# Patient Record
Sex: Female | Born: 1945 | Race: White | Hispanic: No | Marital: Married
Health system: Southern US, Community
[De-identification: ages and names within clinical notes are randomized; demographics above are authoritative.]

## PROBLEM LIST (undated history)

## (undated) DIAGNOSIS — E785 Hyperlipidemia, unspecified: Secondary | ICD-10-CM

## (undated) DIAGNOSIS — E559 Vitamin D deficiency, unspecified: Secondary | ICD-10-CM

## (undated) DIAGNOSIS — F319 Bipolar disorder, unspecified: Secondary | ICD-10-CM

## (undated) DIAGNOSIS — J189 Pneumonia, unspecified organism: Secondary | ICD-10-CM

## (undated) DIAGNOSIS — I10 Essential (primary) hypertension: Secondary | ICD-10-CM

## (undated) DIAGNOSIS — M810 Age-related osteoporosis without current pathological fracture: Secondary | ICD-10-CM

## (undated) DIAGNOSIS — F329 Major depressive disorder, single episode, unspecified: Secondary | ICD-10-CM

## (undated) DIAGNOSIS — F32A Depression, unspecified: Secondary | ICD-10-CM

## (undated) DIAGNOSIS — R110 Nausea: Secondary | ICD-10-CM

## (undated) DIAGNOSIS — E7800 Pure hypercholesterolemia, unspecified: Secondary | ICD-10-CM

## (undated) DIAGNOSIS — K219 Gastro-esophageal reflux disease without esophagitis: Secondary | ICD-10-CM

## (undated) DIAGNOSIS — K589 Irritable bowel syndrome without diarrhea: Secondary | ICD-10-CM

## (undated) HISTORY — PX: APPENDECTOMY: SHX54

## (undated) HISTORY — DX: Essential (primary) hypertension: I10

## (undated) HISTORY — DX: Vitamin D deficiency, unspecified: E55.9

## (undated) HISTORY — DX: Age-related osteoporosis without current pathological fracture: M81.0

## (undated) HISTORY — PX: TUBAL LIGATION: SHX77

## (undated) HISTORY — DX: Hyperlipidemia, unspecified: E78.5

## (undated) HISTORY — DX: Major depressive disorder, single episode, unspecified: F32.9

## (undated) HISTORY — DX: Pure hypercholesterolemia, unspecified: E78.00

## (undated) HISTORY — DX: Pneumonia, unspecified organism: J18.9

## (undated) HISTORY — PX: LAPAROSCOPIC OOPHERECTOMY: SHX6507

## (undated) HISTORY — PX: ABDOMINAL HYSTERECTOMY: SHX81

## (undated) HISTORY — DX: Nausea: R11.0

## (undated) HISTORY — DX: Depression, unspecified: F32.A

## (undated) HISTORY — PX: TONSILLECTOMY: SUR1361

## (undated) HISTORY — DX: Irritable bowel syndrome without diarrhea: K58.9

## (undated) HISTORY — DX: Gastro-esophageal reflux disease without esophagitis: K21.9

## (undated) HISTORY — DX: Bipolar disorder, unspecified: F31.9

---

## 1997-10-28 ENCOUNTER — Other Ambulatory Visit: Admission: RE | Admit: 1997-10-28 | Discharge: 1997-10-28 | Payer: Self-pay | Admitting: *Deleted

## 1999-01-15 ENCOUNTER — Other Ambulatory Visit: Admission: RE | Admit: 1999-01-15 | Discharge: 1999-01-15 | Payer: Self-pay | Admitting: *Deleted

## 2017-12-26 ENCOUNTER — Other Ambulatory Visit: Payer: Self-pay | Admitting: Orthopedic Surgery

## 2017-12-26 DIAGNOSIS — M4712 Other spondylosis with myelopathy, cervical region: Secondary | ICD-10-CM

## 2017-12-30 ENCOUNTER — Ambulatory Visit
Admission: RE | Admit: 2017-12-30 | Discharge: 2017-12-30 | Disposition: A | Discharge status: Home / Self Care | Payer: Medicare Other | Source: Ambulatory Visit | Attending: Orthopedic Surgery | Admitting: Orthopedic Surgery

## 2017-12-30 DIAGNOSIS — M4712 Other spondylosis with myelopathy, cervical region: Secondary | ICD-10-CM

## 2018-01-03 ENCOUNTER — Telehealth: Payer: Self-pay | Admitting: Neurology

## 2018-01-03 ENCOUNTER — Encounter

## 2018-01-03 ENCOUNTER — Ambulatory Visit (INDEPENDENT_AMBULATORY_CARE_PROVIDER_SITE_OTHER): Payer: Medicare Other | Admitting: Neurology

## 2018-01-03 VITALS — BP 123/77 | HR 78 | Ht 62.5 in | Wt 137.5 lb

## 2018-01-03 DIAGNOSIS — R296 Repeated falls: Secondary | ICD-10-CM | POA: Diagnosis not present

## 2018-01-03 DIAGNOSIS — R252 Cramp and spasm: Secondary | ICD-10-CM

## 2018-01-03 DIAGNOSIS — W19XXXS Unspecified fall, sequela: Secondary | ICD-10-CM

## 2018-01-03 NOTE — Progress Notes (Signed)
Subjective:    Patient ID: Rachel Rhodes is a 72 y.o. female.  HPI     Rachel Foley, MD, PhD [facility] [address], [address] [address] [place], [place] [address]  Dear Rachel Rhodes,   I saw your patient, Rachel Rhodes, upon your kind request, in my neurologic clinic today for initial consultation of her tremors and recurrent falls. The patient is accompanied by her husband today. As you know, Rachel Rhodes is a 72 year old right-handed woman with an underlying medical history of vitamin D deficiency, reflux disease, IBS, history of C. difficile colitis, history of pneumonia, smoking, hypertension, osteoporosis, bipoler d/o, and mildly overweight state, who reports involuntary jerk-like movements in her lower extremities and recurrent falls and balance issues. Her husband reports that since she came off of Geodon her symptoms have improved greatly. She was on Geodon for at least a year and has been off of it for about 2 months. She was started on Zyprexa after she stopped the Geodon. Unfortunately, the Zyprexa is not working as well for her mood disorder and she did quite well on Geodon from the mood standpoint. Her lower extremities were jerking involuntarily and that led to some falls. This has essentially subsided. I reviewed your office note from 11/10/17.  Of note, she recently fell and injured her wrist, sustained a L distal radius fracture.  She had a neck MRI without contrast on 12/30/2017 and I reviewed the results: IMPRESSION: 1. History of trauma.  No fracture or evidence soft tissue injury. 2. 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 3. 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 4. Foraminal impingement on the left at C3-4, bilaterally at C5-6, and right more than left at C6-7. 5. Diffusely patent spinal canal.  Of note, patient is on multiple potentially sedating medications including imipramine, fluoxetine, modafinil,  olanzapine, Xanax generic. She does not drink any alcohol, she tries to hydrate well with water, she smokes about 5 cigarettes per day on average.  She had a CTH w/wo contrast through your office on 11/14/2017 and I reviewed the results: Impression: No acute intracranial abnormality. Small vessel ischemic changes. No evidence of tumor, hemorrhage, hydrocephalus, herniation or acute infarction. No change.   Her Past Medical History Is Significant For: Past Medical History:  Diagnosis Date  . Bipolar disorder (HCC)   . Depressive disorder   . GERD (gastroesophageal reflux disease)   . Hypercholesteremia   . Hyperlipemia   . Hypertension   . IBS (irritable bowel syndrome)   . Nausea   . Osteoporosis   . Pneumonia   . Vitamin D deficiency     Her Past Surgical History Is Significant For:  Her Family History Is Significant For: No family history on file.  Her Social History Is Significant For: Social History   Socioeconomic History  . Marital status: Married    Spouse name: Not on file  . Number of children: Not on file  . Years of education: Not on file  . Highest education level: Not on file  Occupational History  . Not on file  Social Needs  . Financial resource strain: Not on file  . Food insecurity:    Worry: Not on file    Inability: Not on file  . Transportation needs:    Medical: Not on file    Non-medical: Not on file  Tobacco Use  . Smoking status: Not on file  Substance and Sexual Activity  .  Alcohol use: Not on file  . Drug use: Not on file  . Sexual activity: Not on file  Lifestyle  . Physical activity:    Days per week: Not on file    Minutes per session: Not on file  . Stress: Not on file  Relationships  . Social connections:    Talks on phone: Not on file    Gets together: Not on file    Attends religious service: Not on file    Active member of club or organization: Not on file    Attends meetings of clubs or organizations: Not on file     Relationship status: Not on file  Other Topics Concern  . Not on file  Social History Narrative  . Not on file    Her Allergies Are:  Allergies not on file:   Her Current Medications Are:  Outpatient Encounter Medications as of 01/03/2018  Medication Sig  . acetaminophen (TYLENOL) 500 MG tablet Take 500 mg by mouth every 6 (six) hours as needed.  Marland Kitchen alendronate (FOSAMAX) 70 MG tablet Take 70 mg by mouth once a week. Take with a full glass of water on an empty stomach.  . ALPRAZolam (XANAX) 0.5 MG tablet Take 0.5 mg by mouth 3 (three) times daily.  Marland Kitchen atorvastatin (LIPITOR) 10 MG tablet Take 10 mg by mouth daily.  Marland Kitchen bismuth subsalicylate (PEPTO-BISMOL) 262 MG/15ML suspension Take 15 mLs by mouth every 6 (six) hours as needed for indigestion.  . cycloSPORINE (RESTASIS) 0.05 % ophthalmic emulsion Place 1 drop into both eyes 2 (two) times daily.  Marland Kitchen dicyclomine (BENTYL) 10 MG capsule Take 10 mg by mouth 4 (four) times daily as needed.   . diphenoxylate-atropine (LOMOTIL) 2.5-0.025 MG tablet Take by mouth 4 (four) times daily as needed for diarrhea or loose stools.  . ergocalciferol (VITAMIN D2) 50000 units capsule Take 50,000 Units by mouth once a week.  Marland Kitchen FLUoxetine (PROZAC) 40 MG capsule Take 80 mg by mouth daily.  Marland Kitchen imipramine (TOFRANIL) 25 MG tablet Take 75 mg by mouth at bedtime.  . modafinil (PROVIGIL) 100 MG tablet Take 100-200 mg by mouth daily.   Marland Kitchen OLANZapine (ZYPREXA) 5 MG tablet Take 5 mg by mouth at bedtime.  . ranitidine (ZANTAC) 150 MG tablet Take 300 mg by mouth 2 (two) times daily.   . verapamil (CALAN-SR) 240 MG CR tablet Take 240 mg by mouth at bedtime.  . [DISCONTINUED] cholestyramine light (PREVALITE) 4 g packet Take 4 g by mouth 2 (two) times daily.  . [DISCONTINUED] conjugated estrogens (PREMARIN) vaginal cream Place 1 Applicatorful vaginally daily.  . [DISCONTINUED] naproxen sodium (ALEVE) 220 MG tablet Take 220 mg by mouth.  . [DISCONTINUED] nystatin-triamcinolone  (MYCOLOG II) cream Apply 1 application topically 2 (two) times daily.  . [DISCONTINUED] Omega-3 Fatty Acids (FISH OIL) 1200 MG CAPS Take by mouth.  . [DISCONTINUED] ziprasidone (GEODON) 20 MG capsule Take 20 mg by mouth 2 (two) times daily with a meal.   No facility-administered encounter medications on file as of 01/03/2018.   : Review of Systems:  Out of a complete 14 point review of systems, all are reviewed and negative with the exception of these symptoms as listed below:  Review of Systems  Neurological:       Pt mentioned that she has been having falls and tremors for a year. Pt had a fall 3 weeks ago. Pt mentioned that she has some tremors in her legs.    Objective:  Neurological Exam  Physical Exam Physical Examination:   Vitals:   01/03/18 0948  BP: 123/77  Pulse: 78    General Examination: The patient is a very pleasant 72 y.o. female in no acute distress. She appears well-developed and well-nourished and well groomed.   HEENT: Normocephalic, atraumatic, pupils are equal, round and reactive to light and accommodation. Extraocular tracking is good without limitation to gaze excursion or nystagmus noted. Normal smooth pursuit is noted. Hearing is mildly impaired. Face is symmetric with normal facial animation and normal facial sensation. Speech is clear with no dysarthria noted. There is no hypophonia. There is no lip, neck/head, jaw or voice tremor. Neck is supple with full range of passive and active motion. There are no carotid bruits on auscultation. Oropharynx exam reveals: moderate mouth dryness, adequate dental hygiene.  No orofacial dyskinesias.  Chest: Clear to auscultation without wheezing, rhonchi or crackles noted.  Heart: S1+S2+0, regular and normal without murmurs, rubs or gallops noted.   Abdomen: Soft, non-tender and non-distended with normal bowel sounds appreciated on auscultation.  Extremities: There is no pitting edema in the distal lower extremities  bilaterally. Pedal pulses are intact.  Skin: Warm and dry without trophic changes noted.  Musculoskeletal: exam reveals no obvious joint deformities, tenderness or joint swelling or erythema. Left wrist splint in place.  Neurologically:  Mental status: The patient is awake, alert and oriented in all 4 spheres. Her immediate and remote memory, attention, language skills and fund of knowledge are appropriate. There is no evidence of aphasia, agnosia, apraxia or anomia. Speech is clear with normal prosody and enunciation. Thought process is linear. Mood is constricted and affect is blunted.  Cranial nerves II - XII are as described above under HEENT exam. In addition: shoulder shrug is normal with equal shoulder height noted. Motor exam: Normal bulk, strength and tone is noted. There is no drift, resting tremor or rebound. She has no significant postural or action tremor, no myoclonus, no athetoid movements, no dystonic posturing. Romberg is negative. Reflexes are 2+ throughout, L arm not checked due to splinting. Babinski: Toes are flexor bilaterally. Fine motor skills and coordination: intact with normal finger taps, normal hand movements, normal rapid alternating patting, normal foot taps and normal foot agility.  Cerebellar testing: No dysmetria or intention tremor on finger to nose testing. Heel to shin is unremarkable bilaterally. There is no truncal or gait ataxia.  Sensory exam: intact to light touch in the upper and lower extremities.  Gait, station and balance: She stands slowly and Walks slowly and cautiously, no walking aid. Tandem walk is not possible for her as she is insecure. No shuffling, preserved arm swing bilaterally.  Assessment and Plan:   In summary, OZELL FERRERA is a very pleasant 73 y.o.-year old female with an underlying medical history of vitamin D deficiency, reflux disease, IBS, history of C. difficile colitis, history of pneumonia, smoking, hypertension, osteoporosis,  bipoler d/o, and mildly overweight state, who presents for evaluation of her recurrent falls and involuntary jerking in her lower extremities. On examination, she has no focal neurological finding, no parkinsonism, no tremor. She reports that her involuntary leg movements improved after she stopped the Geodon. I do worry about her psychotropic medications as she is at risk for balance issues and sedation as well as involuntary movements. Nevertheless, she reports that she would rather suffer from medication related side effects than have suboptimally controlled bipolar disease. Unfortunately, she has struggled with her mood disorder for decades. She has tried and failed  multiple medications. She has close follow-up with her psychiatrist. She is advised that I would like to proceed with further testing in the form of brain MRI without contrast and EMG and nerve conduction testing of her lower extremities to rule out any other causes of her symptoms. Nevertheless, at this point I may not be able to add a whole lot especially if her test results are nonrevealing. We will keep her posted as to the test results by phone call. We talked about general recommendations for gait disturbance including changing positions slowly, making enough like to see at night, using a cane or walker if possible, staying well hydrated, avoiding alcohol and she was also encouraged to quit smoking. I suggested I see her back on an as-needed basis. We will call her with her test results in the interim once they are available. I answered all their questions today and the patient and her husband were in agreement.  Thank you very much for allowing me to participate in the care of this nice patient. If I can be of any further assistance to you please do not hesitate to call me at [phone].  Sincerely,   Rachel Foley, MD, PhD

## 2018-01-03 NOTE — Patient Instructions (Signed)
I'm not sure how to explain your involuntary movements. I do not see a primary neurological cause of your symptoms. In particular, you do not have a tremor currently, no signs of parkinsonism. I do believe that you are at risk for falls secondary to medication effect from taking multiple sedating and psychotropic medications including Xanax, generic Zyprexa, and imipramine.  The Geodon may have caused you to have abnormal jerking and involuntary movements.  Please remember to stand up slowly and get your bearings first turn slowly, no bending down to pick anything, no heavy lifting, be extra careful at night and first thing in the morning. Also, be careful in the Bathroom and the kitchen. Use your cane, stay well hydrated.  As discussed, I will order an EMG and nerve conduction velocity test, which is an electrical nerve and muscle test, which we will schedule. We will call you with the results. We will do a brain scan, called MRI and call you with the test results. We will have to schedule you for this on a separate date. This test requires authorization from your insurance, and we will take care of the insurance process.  So long as your Test results are reassuring, I will see you back on an as-needed basis.

## 2018-01-03 NOTE — Telephone Encounter (Signed)
Medicare/bcbs order sent to GI. No auth they will reach out to the pt to schedule.

## 2018-01-11 ENCOUNTER — Ambulatory Visit
Admission: RE | Admit: 2018-01-11 | Discharge: 2018-01-11 | Disposition: A | Discharge status: Home / Self Care | Payer: Medicare Other | Source: Ambulatory Visit | Attending: Neurology | Admitting: Neurology

## 2018-01-11 DIAGNOSIS — R296 Repeated falls: Secondary | ICD-10-CM

## 2018-01-11 DIAGNOSIS — R252 Cramp and spasm: Secondary | ICD-10-CM

## 2018-01-15 ENCOUNTER — Telehealth: Payer: Self-pay

## 2018-01-15 NOTE — Progress Notes (Signed)
Please call patient regarding the recent brain MRI without contrast: The brain scan showed a normal structure of the brain and mild volume loss which we call atrophy. There were changes in the deeper structures of the brain, which we call white matter changes or microvascular changes. These were reported as moderate in Her case. These are tiny white spots, that occur with time and are seen in a variety of conditions, including with normal aging, chronic hypertension, chronic headaches, especially migraine HAs, chronic diabetes, chronic hyperlipidemia. These are not strokes and no mass or lesions were seen which is reassuring. Again, there were no acute findings, such as a stroke, or mass or blood products. No further action is required on this test at this time, other than re-enforcing the importance of good blood pressure control, good cholesterol control, good blood sugar control, and weight management and of course complete smoking cessation is important. Please remind patient to keep any upcoming appointments or tests and to call us with any interim questions, concerns, problems or updates. EMG/NCV scheduled for next month, please remind her.   Thanks,  Huston FoleySaima Deanglo Hissong, MD, PhD

## 2018-01-15 NOTE — Telephone Encounter (Signed)
I called pt to discuss her MRI results. No answer, left a message asking her to call me back. 

## 2018-01-15 NOTE — Telephone Encounter (Signed)
-----   Message from Huston FoleySaima Athar, MD sent at 01/15/2018  8:32 AM EDT ----- Please call patient regarding the recent brain MRI without contrast: The brain scan showed a normal structure of the brain and mild volume loss which we call atrophy. There were changes in the deeper structures of the brain, which we call white matter changes or microvascular changes. These were reported as moderate in Her case. These are tiny white spots, that occur with time and are seen in a variety of conditions, including with normal aging, chronic hypertension, chronic headaches, especially migraine HAs, chronic diabetes, chronic hyperlipidemia. These are not strokes and no mass or lesions were seen which is reassuring. Again, there were no acute findings, such as a stroke, or mass or blood products. No further action is required on this test at this time, other than re-enforcing the importance of good blood pressure control, good cholesterol control, good blood sugar control, and weight management and of course complete smoking cessation is important. Please remind patient to keep any upcoming appointments or tests and to call us with any interim questions, concerns, problems or updates. EMG/NCV scheduled for next month, please remind her.   Thanks,  Huston FoleySaima Athar, MD, PhD

## 2018-01-16 NOTE — Telephone Encounter (Signed)
Patient is returning your call.  

## 2018-01-16 NOTE — Telephone Encounter (Signed)
I called pt again to discuss her sleep study results, no answer, left a message asking her to call me back. 

## 2018-01-17 NOTE — Telephone Encounter (Signed)
I called pt and advised her of her MRI results and recommendations from Dr. Frances FurbishAthar. Pt asked that I send a copy of these results to Darryl [facility]Amanda [facility], PA [facility]. Pt verbalized understanding of results. Pt had no questions at this time but was encouraged to call back if questions arise.

## 2018-02-06 ENCOUNTER — Ambulatory Visit (INDEPENDENT_AMBULATORY_CARE_PROVIDER_SITE_OTHER): Payer: Medicare Other | Admitting: Neurology

## 2018-02-06 ENCOUNTER — Telehealth: Payer: Self-pay

## 2018-02-06 ENCOUNTER — Encounter: Payer: Self-pay | Admitting: Neurology

## 2018-02-06 DIAGNOSIS — R296 Repeated falls: Secondary | ICD-10-CM

## 2018-02-06 DIAGNOSIS — R269 Unspecified abnormalities of gait and mobility: Secondary | ICD-10-CM

## 2018-02-06 DIAGNOSIS — R252 Cramp and spasm: Secondary | ICD-10-CM

## 2018-02-06 DIAGNOSIS — W19XXXS Unspecified fall, sequela: Secondary | ICD-10-CM

## 2018-02-06 NOTE — Progress Notes (Addendum)
Please call and advise the patient that the recent EMG and nerve conduction velocity test, which is the electrical nerve and muscle test we we performed, was reported as fairly normal. There is no evidence of widespread nerve damage, or muscle damage or pinched nerves from the back. There is evidence of compression nerve irritation below the knees in keeping with peroneal neuropathy, which is typically caused by crossing over legs. I would recommend she avoid, crossing her legs, otherwise no further action is required on this test at this time. At this juncture, she can FU with PCP.  thx sa     MNC    Nerve / Sites Muscle Latency Ref. Amplitude Ref. Rel Amp Segments Distance Velocity Ref. Area    ms ms mV mV %  cm m/s m/s mVms  R Peroneal - EDB     Ankle EDB 7.5 ?6.5 1.7 ?2.0 100 Ankle - EDB 9   8.2     Fib head EDB 14.2  1.0  60.1 Fib head - Ankle 26 39 ?44 5.3     Pop fossa EDB 16.4  1.4  139 Pop fossa - Fib head 10 46 ?44 7.6         Pop fossa - Ankle      L Peroneal - EDB     Ankle EDB 7.0 ?6.5 3.8 ?2.0 100 Ankle - EDB 9   19.8     Fib head EDB 13.3  3.6  94.3 Fib head - Ankle 25 40 ?44 19.0     Pop fossa EDB 14.9  3.6  102 Pop fossa - Fib head 8 50 ?44 16.8         Pop fossa - Ankle      R Tibial - AH     Ankle AH 5.5 ?5.8 6.4 ?4.0 100 Ankle - AH 9   15.4     Pop fossa AH 12.7  5.4  85.1 Pop fossa - Ankle 31 43 ?41 16.5  L Tibial - AH     Ankle AH 4.6 ?5.8 8.0 ?4.0 100 Ankle - AH 9   17.3     Pop fossa AH 12.1  6.5  81.4 Pop fossa - Ankle 31 41 ?41 18.1             SNC    Nerve / Sites Rec. Site Peak Lat Ref.  Amp Ref. Segments Distance    ms ms V V  cm  R Sural - Ankle (Calf)     Calf Ankle 4.0 ?4.4 6 ?6 Calf - Ankle 14  L Sural - Ankle (Calf)     Calf Ankle 4.2 ?4.4 6 ?6 Calf - Ankle 14  R Superficial peroneal - Ankle     Lat leg Ankle 4.1 ?4.4 5 ?6 Lat leg - Ankle 14  L Superficial peroneal - Ankle     Lat leg Ankle 4.2 ?4.4 6 ?6 Lat leg - Ankle 14              F   Wave    Nerve F Lat Ref.   ms ms  R Tibial - AH 50.6 ?56.0  L Tibial - AH 51.0 ?56.0

## 2018-02-06 NOTE — Progress Notes (Signed)
Please refer to EMG and nerve conduction procedure note.  

## 2018-02-06 NOTE — Procedures (Signed)
     HISTORY:  Rachel Rhodes is a 72 year old patient with a history of bipolar disorder on antipsychotic medications.  The patient has a balance disorder, she has fallen on occasion.  She is being evaluated for this issue.  She denies any low back pain or pain down the legs, she denies numbness in the feet.  NERVE CONDUCTION STUDIES:  Nerve conduction studies were performed on both lower extremities.  The distal motor latencies for the peroneal nerves were prolonged bilaterally with a low motor amplitude on the right and normal on the left.  The distal motor latencies and motor amplitudes for the posterior tibial nerves were normal bilaterally.  Slowing was seen below the fibular head bilaterally for the peroneal nerves, normal above the fibular head.  The nerve conduction velocities for the posterior tibial nerves were normal bilaterally.  The sensory latencies for the sural and peroneal nerves were normal bilaterally.  The F-wave latencies for the posterior tibial nerves were normal bilaterally.  EMG STUDIES:  EMG study was performed on the right lower extremity:  The tibialis anterior muscle reveals 2 to 4K motor units with full recruitment. No fibrillations or positive waves were seen. The peroneus tertius muscle reveals 2 to 4K motor units with full recruitment. No fibrillations or positive waves were seen. The medial gastrocnemius muscle reveals 1 to 3K motor units with full recruitment. No fibrillations or positive waves were seen. The vastus lateralis muscle reveals 2 to 4K motor units with full recruitment. No fibrillations or positive waves were seen. The iliopsoas muscle reveals 2 to 4K motor units with full recruitment. No fibrillations or positive waves were seen. The biceps femoris muscle (long head) reveals 2 to 4K motor units with full recruitment. No fibrillations or positive waves were seen. The lumbosacral paraspinal muscles were tested at 3 levels, and revealed no abnormalities  of insertional activity at all 3 levels tested. There was good relaxation.   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  EMG evaluation of the right lower extremity was unremarkable, no evidence of an overlying lumbosacral radiculopathy was seen.  Marlan Palau MD 02/06/2018 9:40 AM  [facility] [address] [place], [place] [address]  Phone [phone] Fax [phone]

## 2018-02-06 NOTE — Telephone Encounter (Signed)
I called pt to discuss. No answer, left a message asking her to call me back. 

## 2018-02-06 NOTE — Telephone Encounter (Signed)
-----   Message from Huston Foley, MD sent at 02/06/2018 11:48 AM EDT ----- Please call and advise the patient that the recent EMG and nerve conduction velocity test, which is the electrical nerve and muscle test we we performed, was reported as fairly normal. There is no evidence of widespread nerve damage, or muscle damage or pinched nerves from the back. There is evidence of compression nerve irritation below the knees in keeping with peroneal neuropathy, which is typically caused by crossing over legs. I would recommend she avoid, crossing her legs, otherwise no further action is required on this test at this time. At this juncture, she can FU with PCP.  thx Janene Harvey

## 2018-02-07 NOTE — Telephone Encounter (Signed)
I called pt and left a detailed message on her mobile number per DPR, with her NCV/EMG results and asked her to call us back with any questions.

## 2018-02-14 NOTE — Telephone Encounter (Signed)
I called pt. She reports that she got my VM and has no questions regarding her study. Pt verbalized understanding of results. Pt had no questions at this time but was encouraged to call back if questions arise.

## 2018-02-14 NOTE — Telephone Encounter (Signed)
I called pt again to discuss. No answer, left a message asking her to call me back. 

## 2018-02-14 NOTE — Telephone Encounter (Signed)
Pt returning RNs call stating she did not receive a VM will try to look again but would like RN to call to discuss

## 2018-10-06 DIAGNOSIS — Z9981 Dependence on supplemental oxygen: Secondary | ICD-10-CM | POA: Diagnosis not present

## 2018-10-06 DIAGNOSIS — J9621 Acute and chronic respiratory failure with hypoxia: Secondary | ICD-10-CM

## 2018-10-06 DIAGNOSIS — J9811 Atelectasis: Secondary | ICD-10-CM

## 2018-10-06 DIAGNOSIS — J441 Chronic obstructive pulmonary disease with (acute) exacerbation: Secondary | ICD-10-CM

## 2018-10-07 DIAGNOSIS — J441 Chronic obstructive pulmonary disease with (acute) exacerbation: Secondary | ICD-10-CM

## 2018-10-07 DIAGNOSIS — J9621 Acute and chronic respiratory failure with hypoxia: Secondary | ICD-10-CM

## 2018-10-07 DIAGNOSIS — J9811 Atelectasis: Secondary | ICD-10-CM

## 2018-10-07 DIAGNOSIS — Z9981 Dependence on supplemental oxygen: Secondary | ICD-10-CM

## 2018-10-15 DIAGNOSIS — Z9981 Dependence on supplemental oxygen: Secondary | ICD-10-CM | POA: Diagnosis not present

## 2018-10-15 DIAGNOSIS — J441 Chronic obstructive pulmonary disease with (acute) exacerbation: Secondary | ICD-10-CM | POA: Diagnosis not present

## 2018-10-15 DIAGNOSIS — J9621 Acute and chronic respiratory failure with hypoxia: Secondary | ICD-10-CM | POA: Diagnosis not present

## 2018-10-15 DIAGNOSIS — J9811 Atelectasis: Secondary | ICD-10-CM | POA: Diagnosis not present

## 2018-10-16 DIAGNOSIS — J9811 Atelectasis: Secondary | ICD-10-CM | POA: Diagnosis not present

## 2018-10-16 DIAGNOSIS — Z9981 Dependence on supplemental oxygen: Secondary | ICD-10-CM | POA: Diagnosis not present

## 2018-10-16 DIAGNOSIS — J9621 Acute and chronic respiratory failure with hypoxia: Secondary | ICD-10-CM

## 2018-10-16 DIAGNOSIS — J441 Chronic obstructive pulmonary disease with (acute) exacerbation: Secondary | ICD-10-CM | POA: Diagnosis not present

## 2018-10-17 DIAGNOSIS — J9621 Acute and chronic respiratory failure with hypoxia: Secondary | ICD-10-CM | POA: Diagnosis not present

## 2018-10-17 DIAGNOSIS — Z9981 Dependence on supplemental oxygen: Secondary | ICD-10-CM | POA: Diagnosis not present

## 2018-10-17 DIAGNOSIS — J9811 Atelectasis: Secondary | ICD-10-CM | POA: Diagnosis not present

## 2018-10-17 DIAGNOSIS — J441 Chronic obstructive pulmonary disease with (acute) exacerbation: Secondary | ICD-10-CM | POA: Diagnosis not present

## 2018-10-18 DIAGNOSIS — J9811 Atelectasis: Secondary | ICD-10-CM | POA: Diagnosis not present

## 2018-10-18 DIAGNOSIS — Z9981 Dependence on supplemental oxygen: Secondary | ICD-10-CM | POA: Diagnosis not present

## 2018-10-18 DIAGNOSIS — J441 Chronic obstructive pulmonary disease with (acute) exacerbation: Secondary | ICD-10-CM | POA: Diagnosis not present

## 2018-10-18 DIAGNOSIS — J9621 Acute and chronic respiratory failure with hypoxia: Secondary | ICD-10-CM | POA: Diagnosis not present

## 2018-10-19 DIAGNOSIS — J9811 Atelectasis: Secondary | ICD-10-CM | POA: Diagnosis not present

## 2018-10-19 DIAGNOSIS — J9621 Acute and chronic respiratory failure with hypoxia: Secondary | ICD-10-CM | POA: Diagnosis not present

## 2018-10-19 DIAGNOSIS — J441 Chronic obstructive pulmonary disease with (acute) exacerbation: Secondary | ICD-10-CM | POA: Diagnosis not present

## 2018-10-19 DIAGNOSIS — Z9981 Dependence on supplemental oxygen: Secondary | ICD-10-CM | POA: Diagnosis not present

## 2018-10-20 DIAGNOSIS — J441 Chronic obstructive pulmonary disease with (acute) exacerbation: Secondary | ICD-10-CM | POA: Diagnosis not present

## 2018-10-20 DIAGNOSIS — J9621 Acute and chronic respiratory failure with hypoxia: Secondary | ICD-10-CM | POA: Diagnosis not present

## 2018-10-20 DIAGNOSIS — J9811 Atelectasis: Secondary | ICD-10-CM | POA: Diagnosis not present

## 2018-10-20 DIAGNOSIS — Z9981 Dependence on supplemental oxygen: Secondary | ICD-10-CM | POA: Diagnosis not present

## 2018-10-21 DIAGNOSIS — Z9981 Dependence on supplemental oxygen: Secondary | ICD-10-CM | POA: Diagnosis not present

## 2018-10-21 DIAGNOSIS — J9621 Acute and chronic respiratory failure with hypoxia: Secondary | ICD-10-CM | POA: Diagnosis not present

## 2018-10-21 DIAGNOSIS — J9811 Atelectasis: Secondary | ICD-10-CM | POA: Diagnosis not present

## 2018-10-21 DIAGNOSIS — J441 Chronic obstructive pulmonary disease with (acute) exacerbation: Secondary | ICD-10-CM | POA: Diagnosis not present

## 2018-10-31 DEATH — deceased

## 2020-04-27 IMAGING — MR MR CERVICAL SPINE W/O CM
4 of 5 series · 28 of 48 positions shown · non-contrast
Comparison: Cervical spine CT 12/03/2017

CLINICAL DATA: Fall 2 weeks ago with neck pain and stiffness.

EXAM:
MRI CERVICAL SPINE WITHOUT CONTRAST
TECHNIQUE: Multiplanar, multisequence MR imaging of the cervical spine was
performed. No intravenous contrast was administered.

[Series 2: T2 · sagittal · 3.0mm · 0.66mm/px · 6 of 16 slices shown (1 of 2)]
[im 1/16]
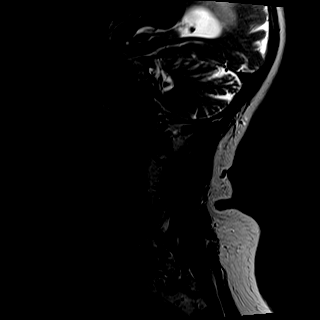
[im 4/16]
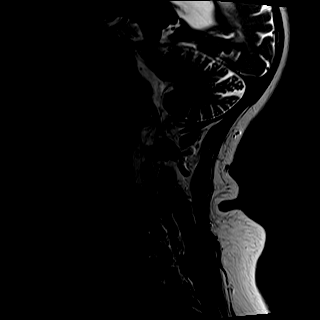
[im 7/16]
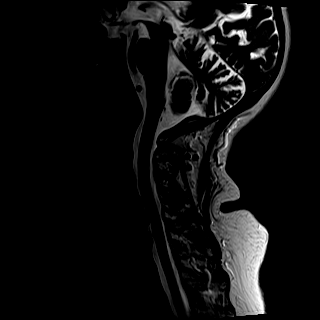
[im 10/16]
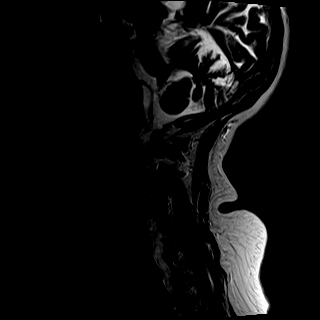
[im 13/16]
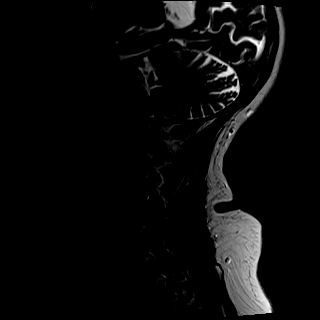
[im 16/16]
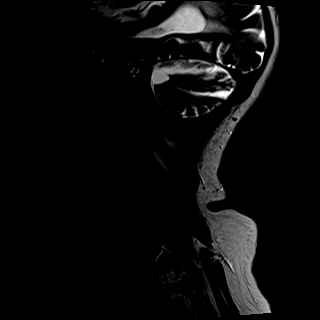

[Series 3: STIR · sagittal · 3.0mm · 0.41mm/px · 7 of 16 slices shown]
[im 1/16]
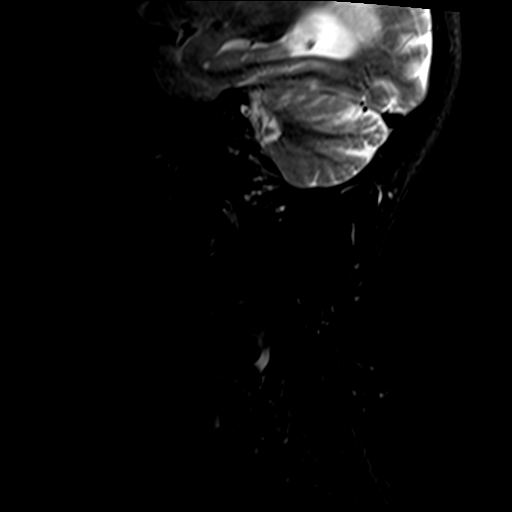
[im 3/16]
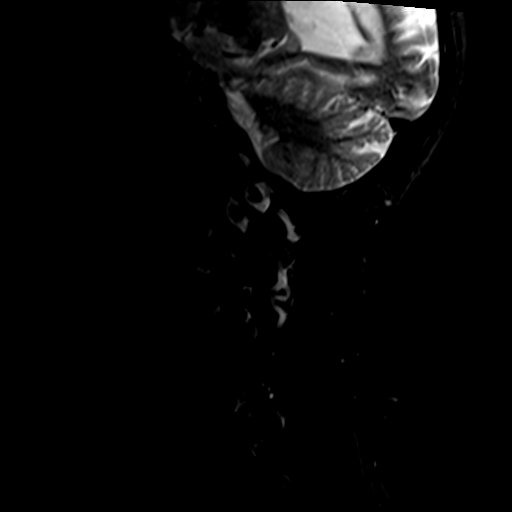
[im 6/16]
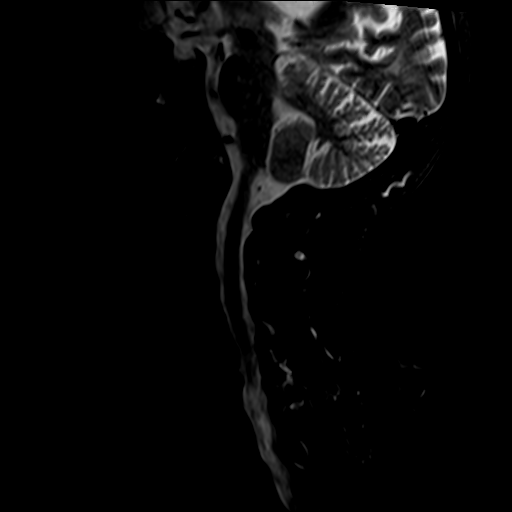
[im 8/16]
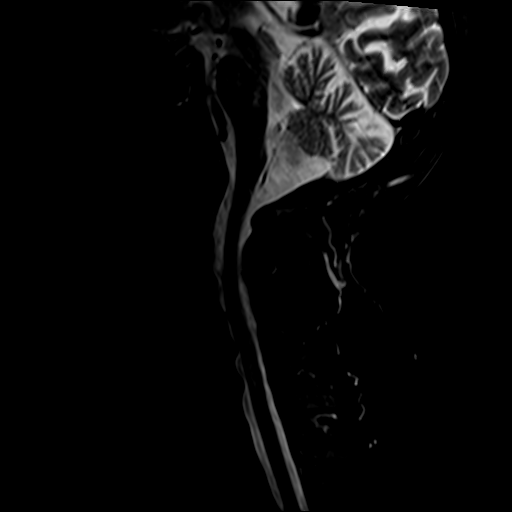
[im 11/16]
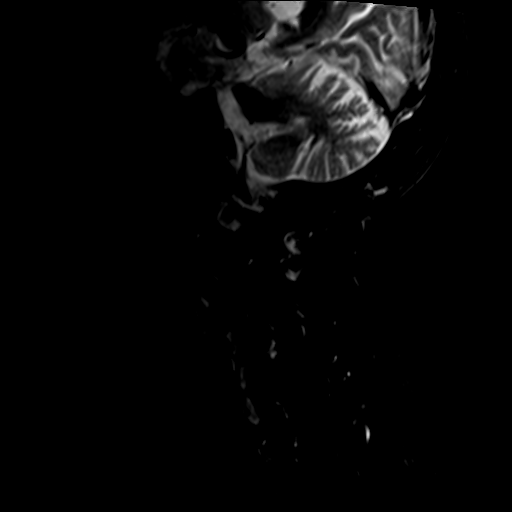
[im 13/16]
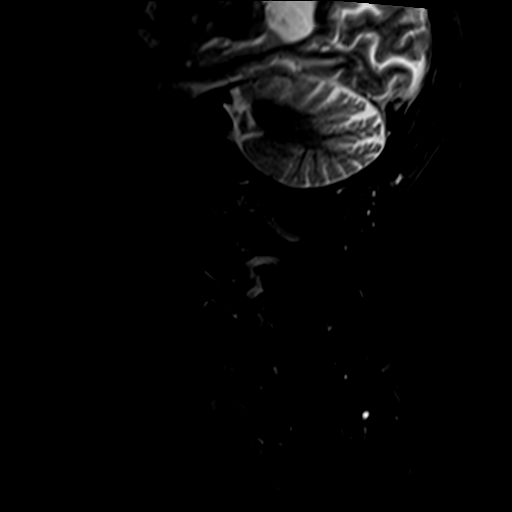
[im 16/16]
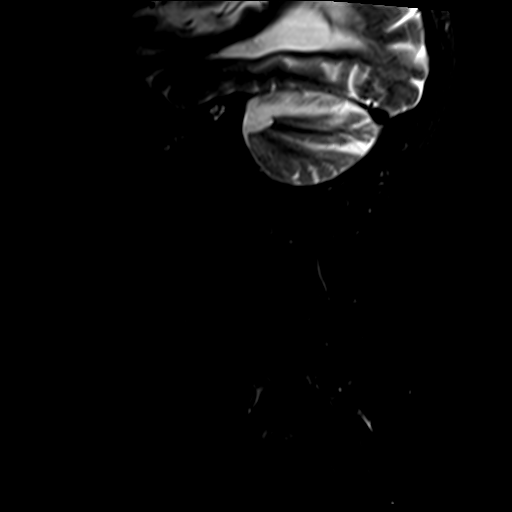

[Series 4: T1 · sagittal · 3.0mm · 0.41mm/px · 7 of 16 slices shown]
[im 1/16]
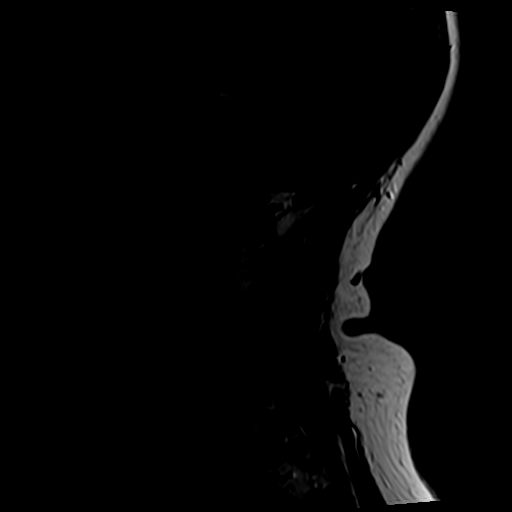
[im 3/16]
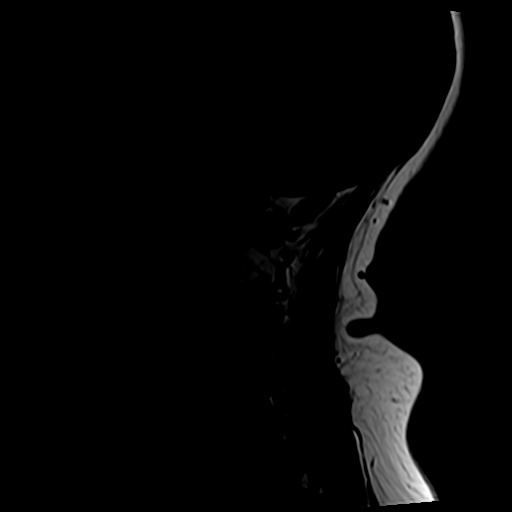
[im 6/16]
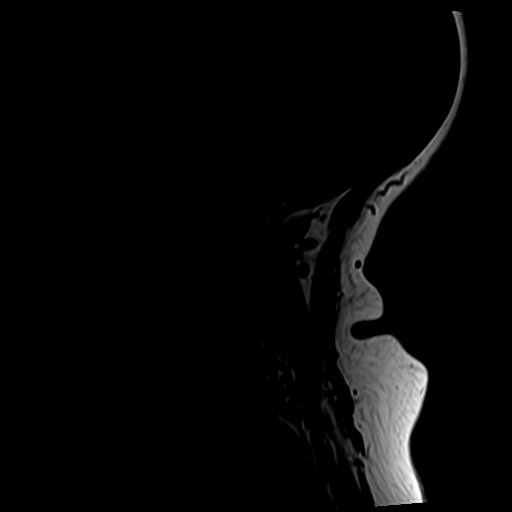
[im 8/16]
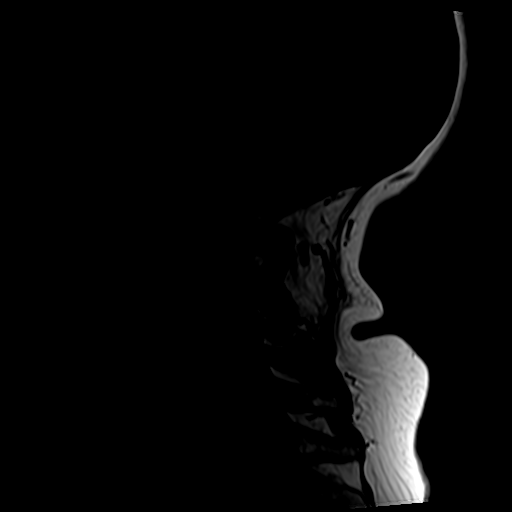
[im 11/16]
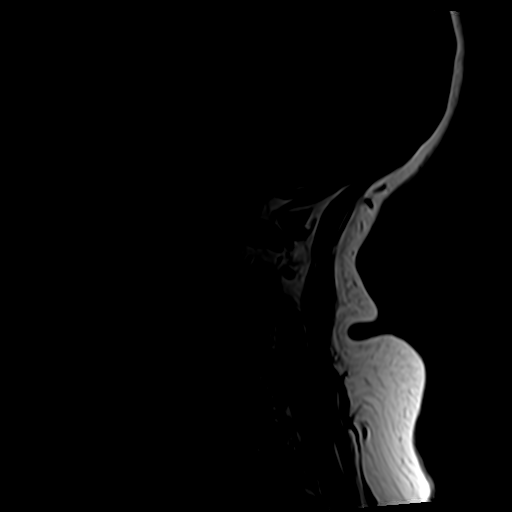
[im 13/16]
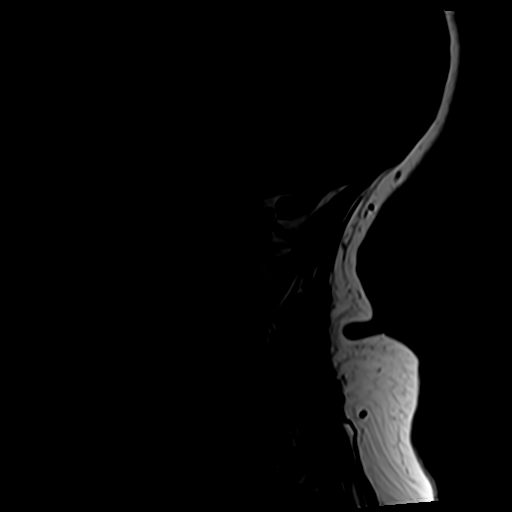
[im 16/16]
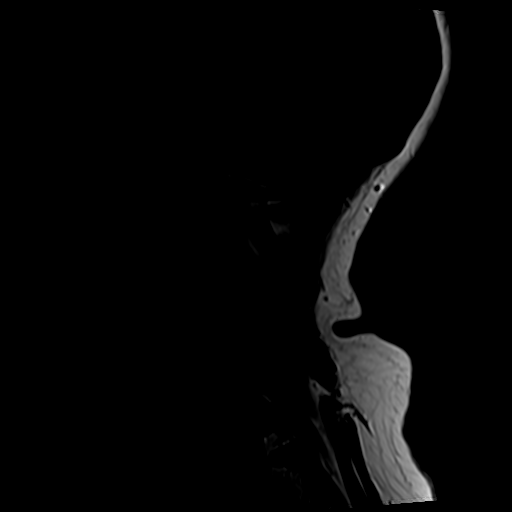

[Series 6: T2 · axial · 3.0mm · 0.70mm/px · z∈[-161,-54]mm · 8 of 31 slices shown (2 of 2)]
[im 1/31]
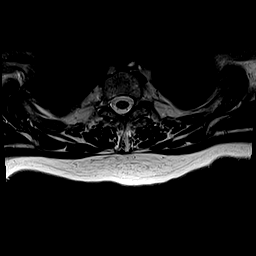
[im 5/31]
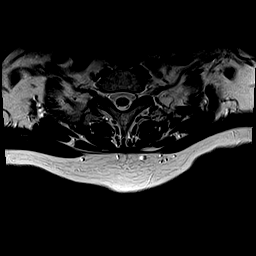
[im 10/31]
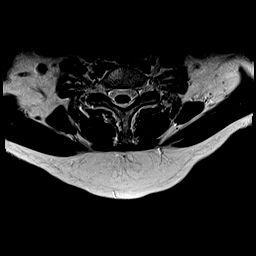
[im 14/31]
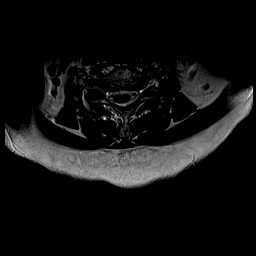
[im 17/31]
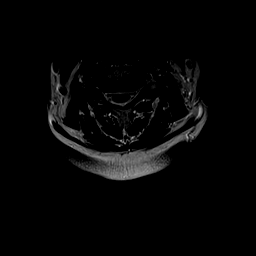
[im 21/31]
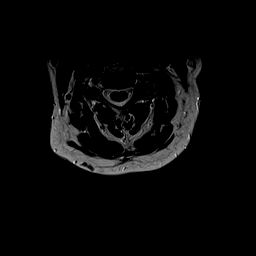
[im 26/31]
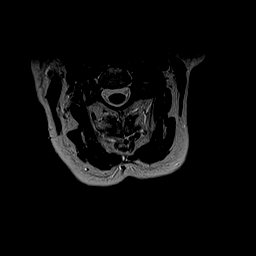
[im 31/31]
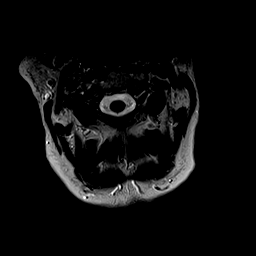

[28 of 48 positions shown; findings below may reference images not displayed]

FINDINGS: Alignment: Slight anterolisthesis at C3-4 and C4-5, facet mediated

Vertebrae: No fracture, evidence of discitis, or bone lesion. No
evidence of ligamentous or soft tissue injury.

Cord: Normal signal and morphology

Posterior Fossa, vertebral arteries, paraspinal tissues: Negative

Disc levels:

C2-3: Asymmetric left facet spurring. Tiny central protrusion. No
impingement

C3-4: Facet arthropathy with advanced hypertrophy on the left. There
is slight anterolisthesis. Mild left uncovertebral spurring.
Moderate left foraminal narrowing

C4-5: Facet spurring asymmetric to the left with mild
anterolisthesis. Minor disc narrowing. Mild left foraminal narrowing
based on axial T2 weighted imaging. Patent canal

C5-6: Greatest level of degenerative disc narrowing and
endplate/uncovertebral ridging. There is biforaminal C6 impingement.
The canal is patent

C6-7: Disc narrowing asymmetric to the right with endplate and
uncovertebral ridging. Right more than left foraminal impingement.
Patent spinal canal.

C7-T1:Minor facet spurring.
IMPRESSION: 1. History of trauma.  No fracture or evidence soft tissue injury.
2. Facet degeneration most notable on the left at C3-4 and C4-5 with
anterolisthesis.
3. Disc degeneration most advanced at C5-6 and C6-7.
4. Foraminal impingement on the left at C3-4, bilaterally at C5-6,
and right more than left at C6-7.
5. Diffusely patent spinal canal.
